# Patient Record
Sex: Male | Born: 1965 | Race: White | Hispanic: No | Marital: Married
Health system: Southern US, Community
[De-identification: ages and names within clinical notes are randomized; demographics above are authoritative.]

---

## 2019-10-07 ENCOUNTER — Other Ambulatory Visit: Payer: Self-pay

## 2019-10-07 ENCOUNTER — Emergency Department: Payer: Worker's Compensation

## 2019-10-07 ENCOUNTER — Encounter: Payer: Self-pay | Admitting: Emergency Medicine

## 2019-10-07 ENCOUNTER — Emergency Department
Admission: EM | Admit: 2019-10-07 | Discharge: 2019-10-07 | Disposition: A | Discharge status: Home / Self Care | Payer: Worker's Compensation | Attending: Emergency Medicine | Admitting: Emergency Medicine

## 2019-10-07 DIAGNOSIS — W231XXA Caught, crushed, jammed, or pinched between stationary objects, initial encounter: Secondary | ICD-10-CM | POA: Insufficient documentation

## 2019-10-07 DIAGNOSIS — S6991XA Unspecified injury of right wrist, hand and finger(s), initial encounter: Secondary | ICD-10-CM

## 2019-10-07 DIAGNOSIS — S60942A Unspecified superficial injury of right middle finger, initial encounter: Secondary | ICD-10-CM | POA: Diagnosis present

## 2019-10-07 DIAGNOSIS — S62622B Displaced fracture of medial phalanx of right middle finger, initial encounter for open fracture: Secondary | ICD-10-CM | POA: Diagnosis not present

## 2019-10-07 DIAGNOSIS — Y9263 Factory as the place of occurrence of the external cause: Secondary | ICD-10-CM | POA: Insufficient documentation

## 2019-10-07 MED ORDER — LIDOCAINE-EPINEPHRINE 2 %-1:100000 IJ SOLN
30.0000 mL | Freq: Once | INTRAMUSCULAR | Status: AC
  Administered 2019-10-07: 30 mL
  Filled 2019-10-07: qty 2

## 2019-10-07 MED ORDER — BACITRACIN ZINC 500 UNIT/GM EX OINT
TOPICAL_OINTMENT | Freq: Once | CUTANEOUS | Status: AC
  Administered 2019-10-07: 1 via TOPICAL
  Filled 2019-10-07: qty 0.9

## 2019-10-07 NOTE — ED Notes (Signed)
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 Contact listed is Frederica Kuster, [place] - [phone] ext [phone]. Unable to get in touch with this listed contact person.  Pts supervisor was previously here but left. Pt does not have supervisor or identification card with him. Therefore, unable to do urine drug screen at this time.

## 2019-10-07 NOTE — ED Triage Notes (Signed)
Pt reports had right hand middle finger crushed at work. Per supervisor, no WC testing required.

## 2019-10-07 NOTE — ED Provider Notes (Signed)
[facility] Emergency Department Provider Note ____________________________________________   First MD Initiated Contact with Patient 10/07/19 1206     (approximate)  I have reviewed the triage vital signs and the nursing notes.  HISTORY  Chief Complaint Finger Injury   HPI Dylan Lawrence is a 54 y.o. malewho presents to the ED for evaluation of finger injury.   Chart review indicates no relevant medical hx.   Patient denies any significant medical history.  Takes no antithrombotic medications.  Patient reports to the ED just after accidental finger injury at work.  Patient works at a [employer] where his dominant, right, hand was accidentally stuck between 2 metal implements and was crushed.   Patient reports an injury isolated to his right/dominant middle finger.  Patient denies any additional injury or trauma throughout his body.  Reports this happened 2 hours prior to my evaluation.  Patient reports severe 10/10 pain at the time of the incident, but now has tapered down to 1/10 pain without medication.  Pain is aching in nature, nonradiating.  History reviewed. No pertinent past medical history.  There are no problems to display for this patient.   History reviewed. No pertinent surgical history.  Prior to Admission medications   Not on File    Allergies Patient has no allergy information on record.  No family history on file.  Social History Social History   Tobacco Use  . Smoking status: Not on file  Substance Use Topics  . Alcohol use: Not on file  . Drug use: Not on file    Review of Systems  Constitutional: No fever/chills Eyes: No visual changes. ENT: No sore throat. Cardiovascular: Denies chest pain. Respiratory: Denies shortness of breath. Gastrointestinal: No abdominal pain.  No nausea, no vomiting.  No diarrhea.  No constipation. Genitourinary: Negative for dysuria. Musculoskeletal: Negative for back  pain.  Positive for finger pain Skin: Negative for rash. Neurological: Negative for headaches, focal weakness or numbness.   ____________________________________________   PHYSICAL EXAM:  VITAL SIGNS: Vitals:   10/07/19 1153  BP: 140/78  Pulse: 72  Resp: 18  Temp: 98 F (36.7 C)  SpO2: 99%    Constitutional: Alert and oriented. Well appearing and in no acute distress. Eyes: Conjunctivae are normal. PERRL. EOMI. Head: Atraumatic. Nose: No congestion/rhinnorhea. Mouth/Throat: Mucous membranes are moist.  Oropharynx non-erythematous. Neck: No stridor. No cervical spine tenderness to palpation. Cardiovascular: Normal rate, regular rhythm. Grossly normal heart sounds.  Good peripheral circulation. Respiratory: Normal respiratory effort.  No retractions. Lungs CTAB. Gastrointestinal: Soft , nondistended, nontender to palpation. No abdominal bruits. No CVA tenderness. Musculoskeletal: No lower extremity tenderness nor edema.  No joint effusions. No signs of acute trauma beyond the right middle finger.  Obvious laceration and deformity to the distal aspect of the middle phalanx of the right third finger.  This digit is distally neurovascularly intact with brisk CRT and intact sensation distal to the injury.  No evidence of nail injury. Of note, he does have a small subungual hematoma that is a couple millimeters wide, that he reports is chronic and has been there for multiple weeks. Nearly circumferential laceration into the subcutaneous tissue over the middle phalanx.  More prominence on the palmar aspect, oblique in orientation in about 4 cm in length.  Dorsal aspect has a smaller, similarly obliquely oriented laceration that is about 2 cm in length.  Hemostatic with direct pressure.  No protruding bone.  Neurologic:  Normal speech and language. No gross  focal neurologic deficits are appreciated. No gait instability noted. Skin:  Skin is warm, dry and intact. No rash  noted. Psychiatric: Mood and affect are normal. Speech and behavior are normal. ____________________________________________  RADIOLOGY  ED MD interpretation: X-ray of affected digit indicates severely displaced transverse fracture to the middle phalanx of the right third finger.  Official radiology report(s): DG Finger Middle Right  Result Date: 10/07/2019 CLINICAL DATA:  Crush injury at work. EXAM: RIGHT MIDDLE FINGER 2+V COMPARISON:  None. FINDINGS: Severely displaced fracture is seen involving the distal portion of the second middle phalanx. Joint spaces are unremarkable. No radiopaque foreign body is noted. IMPRESSION: Severely displaced second middle phalangeal fracture. Electronically Signed   By: Lupita Raider M.D.   On: 10/07/2019 13:13   ____________________________________________   PROCEDURES and INTERVENTIONS  Procedure(s) performed (including Critical Care):  .Nerve Block  Date/Time: 10/07/2019 2:24 PM Performed by: Delton Prairie, MD Authorized by: Delton Prairie, MD   Consent:    Consent obtained:  Verbal   Consent given by:  Patient and parent   Risks discussed:  Allergic reaction, infection, intravenous injection and bleeding   Alternatives discussed:  No treatment Indications:    Indications:  Pain relief and procedural anesthesia Location:    Body area:  Upper extremity   Upper extremity nerve blocked: Digital block of right third finger.   Laterality:  Right Pre-procedure details:    Skin preparation:  Alcohol Skin anesthesia (see MAR for exact dosages):    Skin anesthesia method:  Local infiltration   Local anesthetic:  Lidocaine 1% WITH epi Procedure details (see MAR for exact dosages):    Block needle gauge:  25 G   Steroid injected:  None   Additive injected:  None   Injection procedure:  Anatomic landmarks identified, incremental injection, negative aspiration for blood, anatomic landmarks palpated and introduced needle   Paresthesia:   None Post-procedure details:    Dressing:  None   Outcome:  Anesthesia achieved   Patient tolerance of procedure:  Tolerated well, no immediate complications Reduction of dislocation  Date/Time: 10/07/2019 2:25 PM Performed by: Delton Prairie, MD Authorized by: Delton Prairie, MD  Local anesthesia used: yes (Digital block, separately documented) Anesthesia: digital block  Anesthesia: Local anesthesia used: yes (Digital block, separately documented)  Sedation: Patient sedated: no  Patient tolerance: patient tolerated the procedure well with no immediate complications Comments: Traction and direct manipulation used to empirically realign distal and middle phalanx of right third finger.  Well-tolerated.  Marland Kitchen.Laceration Repair  Date/Time: 10/07/2019 2:25 PM Performed by: Delton Prairie, MD Authorized by: Delton Prairie, MD   Consent:    Consent obtained:  Verbal   Consent given by:  Patient   Risks discussed:  Infection, pain, poor cosmetic result, need for additional repair, retained foreign body and tendon damage   Alternatives discussed:  No treatment Laceration details:    Location:  Finger   Finger location:  R long finger Repair type:    Repair type:  Intermediate Pre-procedure details:    Preparation:  Patient was prepped and draped in usual sterile fashion and imaging obtained to evaluate for foreign bodies Exploration:    Hemostasis achieved with:  Direct pressure   Wound exploration: entire depth of wound probed and visualized   Treatment:    Area cleansed with:  Betadine (Betadine soak for 1 hour)   Amount of cleaning:  Extensive   Irrigation solution:  Sterile saline   Irrigation method:  Pressure wash   Visualized  foreign bodies/material removed: no   Skin repair:    Repair method:  Sutures   Suture size:  4-0   Suture material:  Nylon   Suture technique:  Simple interrupted   Number of sutures:  6 Approximation:    Approximation:  Loose Post-procedure details:     Dressing:  Antibiotic ointment   Patient tolerance of procedure:  Tolerated well, no immediate complications    Medications  lidocaine-EPINEPHrine (XYLOCAINE W/EPI) 2 %-1:100000 (with pres) injection 30 mL (has no administration in time range)  bacitracin ointment (has no administration in time range)    ____________________________________________   MDM / ED COURSE  Otherwise healthy 54 year old male presents to the ED after accidental injury to his right middle finger, with evidence of displaced open fracture, amenable to bedside repair and orthopedic hand follow-up.  Normal vitals.  Exam without evidence of additional injury beyond his right third finger.  Oblique lacerations over the middle phalanx are hemostatic with direct pressure, and deformity indicates likely fracture/dislocation underlying.  This digit is neurovascularly intact distally.  Imaging confirms transverse fracture across the distal aspect of the middle phalanx of the right third finger.  Digital block performed and patient soaked in iodine saline bath for about 1 hour.  This was empirically reduced at the bedside with improved external anatomic alignment.  Laceration loosely repaired.  Spoke with orthopedic surgeon on-call to discuss plan, and he is in agreement with plan of care and helps arrange outpatient follow-up with hand surgery, to be seen tomorrow.  I discussed outpatient management with the patient and his wife, we discussed return precautions for the ED.  Patient medically stable for discharge home.  Clinical Course as of Oct 06 1425  Wed Oct 07, 2019  1253 Digital block placed and patient placed in iodine soak   [DS]  1345 Spoke with Dr. Odis Luster.  Orthopedics on call.  We discussed the patient's presentation and work-up consistent with displaced open fracture.  He recommends iodine soak, there is repair and reapproximation of the bones with follow-up tomorrow with hand surgery. Dr. Stephenie Acres, hand surgeon.     [DS]  1409 Repair complete.  Throughout the repair, I educated patient and wife on outpatient management of this injury.  Following up with hand specialist.  And we discussed return precautions for the ED.   [DS]    Clinical Course User Index [DS] Delton Prairie, MD     ____________________________________________   FINAL CLINICAL IMPRESSION(S) / ED DIAGNOSES  Final diagnoses:  Open displaced fracture of middle phalanx of right middle finger, initial encounter  Injury of finger of right hand, initial encounter     ED Discharge Orders    None       Irven Ingalsbe Katrinka Blazing   Note:  This document was prepared using Dragon voice recognition software and may include unintentional dictation errors.   Delton Prairie, MD 10/07/19 [phone]

## 2019-10-07 NOTE — ED Notes (Addendum)
Called supervisor Pharmacologist at [phone]. No answer.

## 2019-10-07 NOTE — Discharge Instructions (Signed)
Please take Tylenol and ibuprofen/Advil for your pain.  It is safe to take them together, or to alternate them every few hours.  Take up to 1000mg  of Tylenol at a time, up to 4 times per day.  Do not take more than 4000 mg of Tylenol in 24 hours.  For ibuprofen, take 400-600 mg, 4-5 times per day.  Keep the finger in a splint to keep it from moving.   In general, keep the area clean and dry. Then apply a Bacitracin/Neosporin type of antibiotic ointment.  Gently wash with warm soap and water once daily, and again if it gets dirty. Don't vigorously scrub at the wound.  Gently pat dry. Once dry, apply Neosporin / bacitracin type antibiotic ointment. It is important to keep the wound moist with an antibiotic ointment, or a small amount of Vaseline, for the first 5 days. Keeping it moist/clean will help the area heal faster and prevent scar tissue formation.  If you are doing anything active, keep it covered.  If you are sitting around at home, you may leave it uncovered.   Please call Dr. , the orthopedic hand surgeon who can see you in the clinic as soon as tomorrow.  If you develop any fevers, pus coming from this wound, spreading red rash, please return to the ED.

## 2022-01-06 IMAGING — DX DG FINGER MIDDLE 2+V*R*
3 series · 3 of 3 positions shown · non-contrast
Comparison: None.

CLINICAL DATA: Crush injury at work.

EXAM:
RIGHT MIDDLE FINGER 2+V

[finger ap]
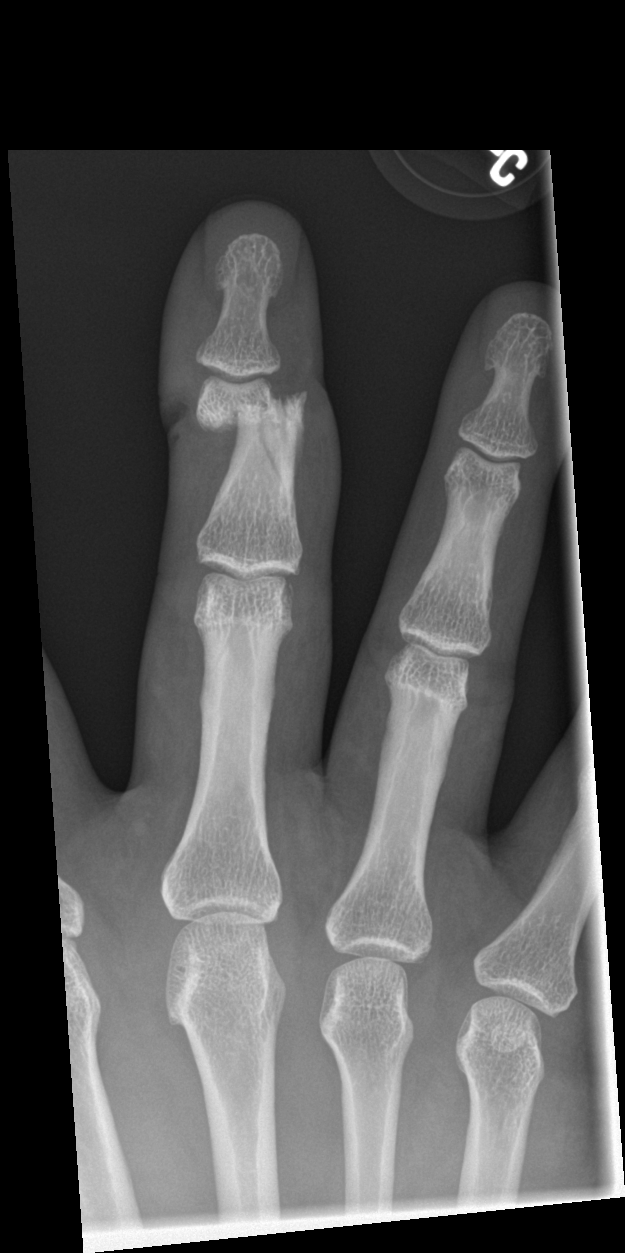

[finger obl]
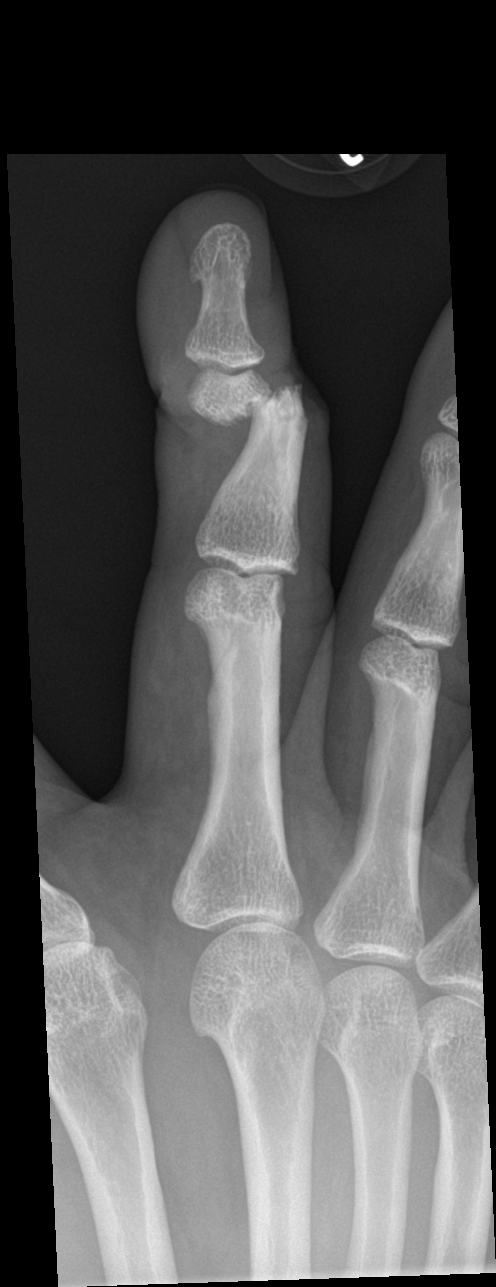

[finger lat]
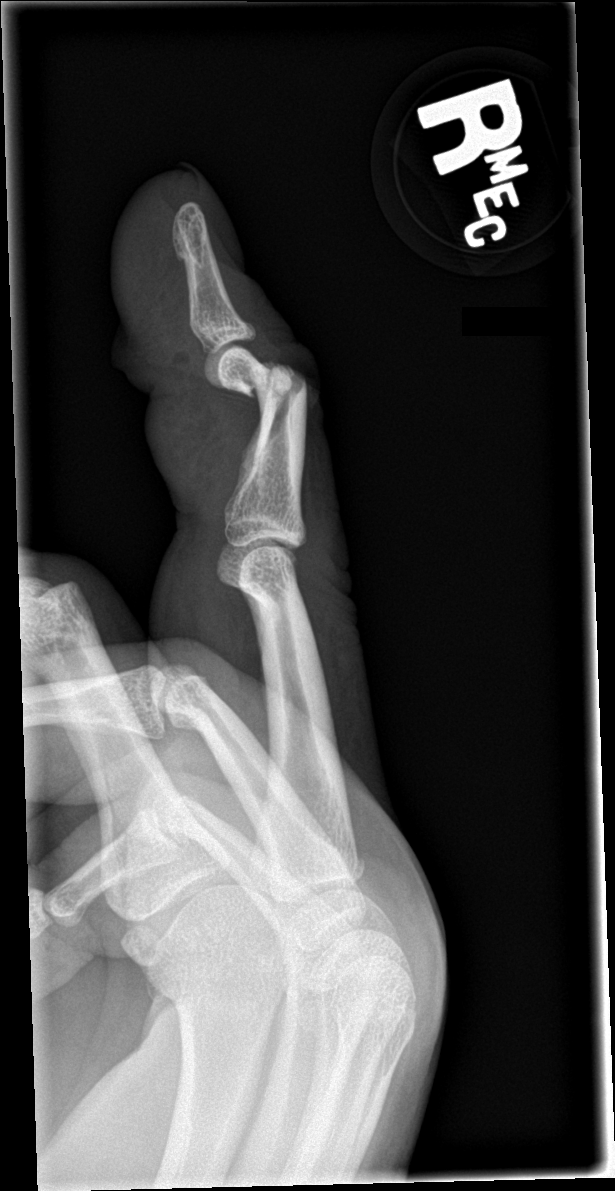

[3 of 3 positions shown; findings below may reference images not displayed]

FINDINGS: Severely displaced fracture is seen involving the distal portion of
the second middle phalanx. Joint spaces are unremarkable. No
radiopaque foreign body is noted.
IMPRESSION: Severely displaced second middle phalangeal fracture.
